# Patient Record
Sex: Female | Born: 1975 | Hispanic: No | Marital: Single | Smoking: Current every day smoker
Health system: Southern US, Community
[De-identification: ages and names within clinical notes are randomized; demographics above are authoritative.]

## PROBLEM LIST (undated history)

## (undated) HISTORY — PX: OTHER SURGICAL HISTORY: SHX169

---

## 2002-03-23 ENCOUNTER — Other Ambulatory Visit: Admission: RE | Admit: 2002-03-23 | Discharge: 2002-03-23 | Payer: Self-pay | Admitting: Family Medicine

## 2003-11-20 ENCOUNTER — Other Ambulatory Visit: Admission: RE | Admit: 2003-11-20 | Discharge: 2003-11-20 | Payer: Self-pay | Admitting: Family Medicine

## 2015-12-22 ENCOUNTER — Encounter: Payer: Self-pay | Admitting: Allergy and Immunology

## 2015-12-22 NOTE — Progress Notes (Signed)
This encounter was created in error - please disregard.

## 2016-06-24 ENCOUNTER — Other Ambulatory Visit (HOSPITAL_COMMUNITY): Payer: Self-pay | Admitting: Obstetrics and Gynecology

## 2016-06-24 DIAGNOSIS — Z3A21 21 weeks gestation of pregnancy: Secondary | ICD-10-CM

## 2016-06-24 DIAGNOSIS — Z3689 Encounter for other specified antenatal screening: Secondary | ICD-10-CM

## 2016-07-06 ENCOUNTER — Encounter (HOSPITAL_COMMUNITY): Payer: Self-pay | Admitting: Obstetrics and Gynecology

## 2016-07-09 ENCOUNTER — Encounter (HOSPITAL_COMMUNITY): Payer: Self-pay

## 2016-07-09 ENCOUNTER — Ambulatory Visit (HOSPITAL_COMMUNITY): Payer: Self-pay

## 2016-07-13 ENCOUNTER — Ambulatory Visit (HOSPITAL_COMMUNITY)
Admission: RE | Admit: 2016-07-13 | Discharge: 2016-07-13 | Disposition: A | Discharge status: Home / Self Care | Payer: Medicaid Other | Source: Ambulatory Visit | Attending: Obstetrics and Gynecology | Admitting: Obstetrics and Gynecology

## 2016-07-13 ENCOUNTER — Encounter (HOSPITAL_COMMUNITY): Payer: Self-pay

## 2016-07-13 ENCOUNTER — Other Ambulatory Visit (HOSPITAL_COMMUNITY): Payer: Self-pay | Admitting: Obstetrics and Gynecology

## 2016-07-13 DIAGNOSIS — Z3689 Encounter for other specified antenatal screening: Secondary | ICD-10-CM

## 2016-07-13 DIAGNOSIS — Z36 Encounter for antenatal screening of mother: Secondary | ICD-10-CM | POA: Diagnosis not present

## 2016-07-13 DIAGNOSIS — Z3A21 21 weeks gestation of pregnancy: Secondary | ICD-10-CM | POA: Insufficient documentation

## 2016-07-13 DIAGNOSIS — Z0372 Encounter for suspected placental problem ruled out: Secondary | ICD-10-CM

## 2016-07-13 NOTE — Progress Notes (Signed)
MFM Consult  Referred for vasa previa  See ultrasound report  No sonographic evidence of vasa previa noted on transabdominal or transvaginal ultrasound today  Spent greater than 1/2 of 15 minute visit face to face counseling

## 2016-07-15 ENCOUNTER — Encounter (HOSPITAL_COMMUNITY): Payer: Self-pay

## 2017-05-18 ENCOUNTER — Encounter (HOSPITAL_COMMUNITY): Payer: Self-pay

## 2020-02-23 DEATH — deceased
